# Patient Record
Sex: Male | Born: 1980 | Race: White | Hispanic: No | Marital: Married | Smoking: Never smoker
Health system: Southern US, Community
[De-identification: ages and names within clinical notes are randomized; demographics above are authoritative.]

## PROBLEM LIST (undated history)

## (undated) DIAGNOSIS — N200 Calculus of kidney: Secondary | ICD-10-CM

## (undated) DIAGNOSIS — R011 Cardiac murmur, unspecified: Secondary | ICD-10-CM

## (undated) HISTORY — DX: Cardiac murmur, unspecified: R01.1

---
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
## 2005-02-22 DIAGNOSIS — N200 Calculus of kidney: Secondary | ICD-10-CM

## 2005-02-22 HISTORY — DX: Calculus of kidney: N20.0

## 2006-10-25 ENCOUNTER — Ambulatory Visit: Payer: Self-pay | Admitting: Specialist

## 2006-11-03 ENCOUNTER — Ambulatory Visit: Payer: Self-pay | Admitting: Specialist

## 2009-02-12 IMAGING — CT CT ABD-PELV W/O CM
1 of 2 series · 15 of 32 positions shown, 19 images · non-contrast
Comparison: none

REASON FOR EXAM: rt flank pain nausea  kidney stone
COMMENTS:

PROCEDURE:     CT  - CT ABDOMEN AND PELVIS W[DATE]  [DATE]
RESULT:     Comparison: No available comparison exam.
TECHNIQUE: CT examination of the abdomen and pelvis was performed without
contrast. Collimation is 3 mm.

[Series 2: soft tissue · axial · 0.73mm/px · z∈[-1242,-812]mm · 15 of 161 slices shown, 19 images]
[im 12/161  soft-tissue]
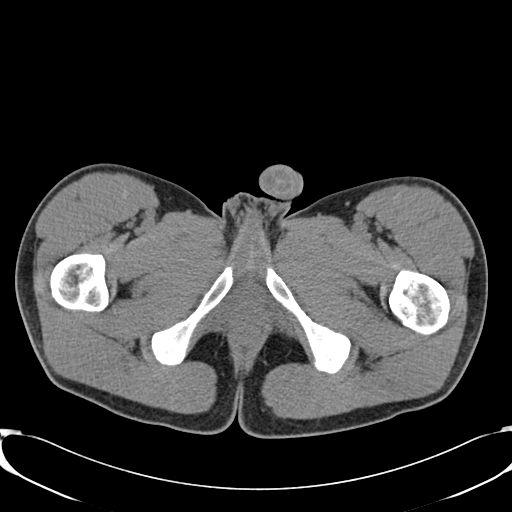
[im 12/161  bone]
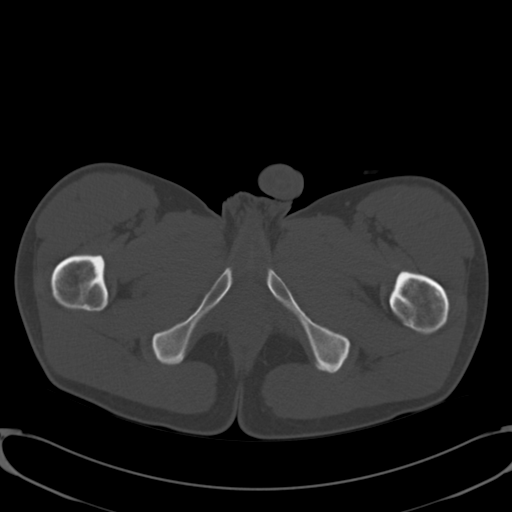
[im 24/161  soft-tissue]
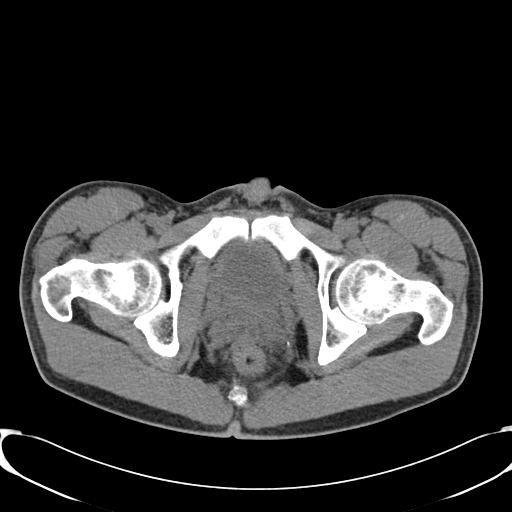
[im 36/161  soft-tissue]
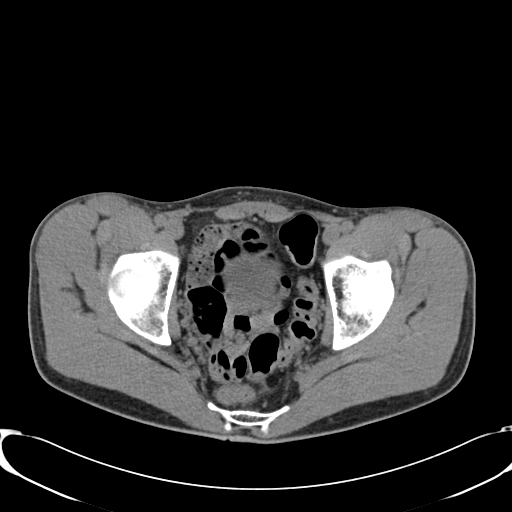
[im 48/161  soft-tissue]
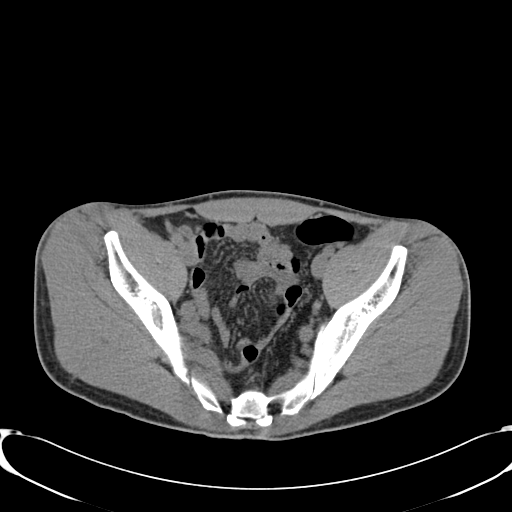
[im 60/161  soft-tissue]
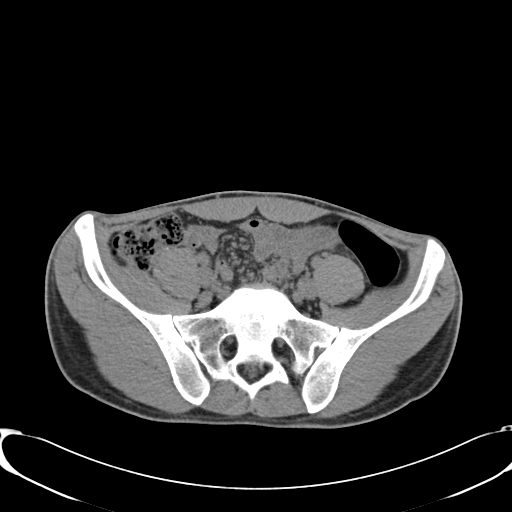
[im 72/161  soft-tissue]
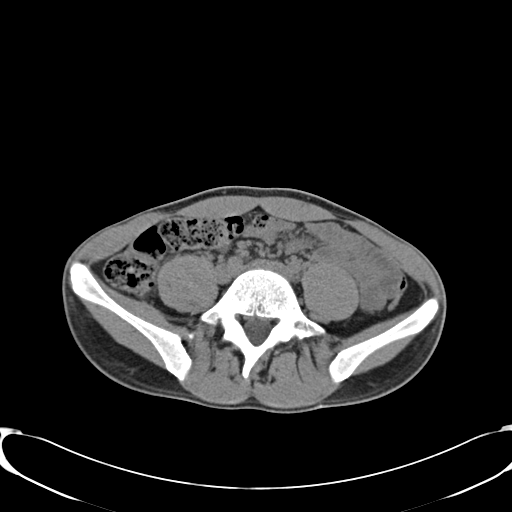
[im 83/161  soft-tissue]
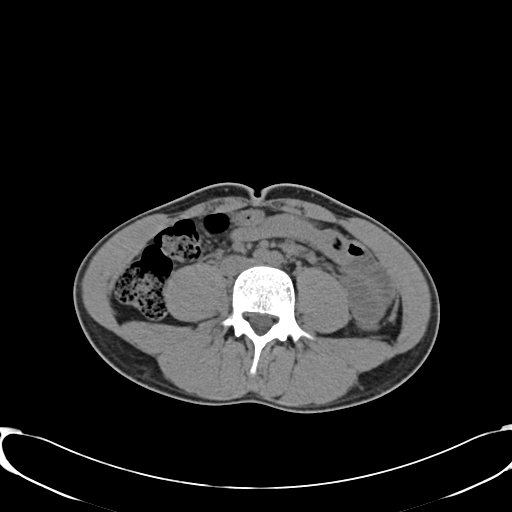
[im 95/161  soft-tissue]
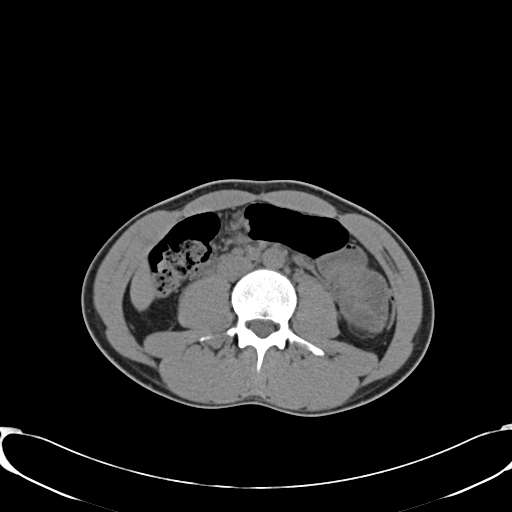
[im 107/161  soft-tissue]
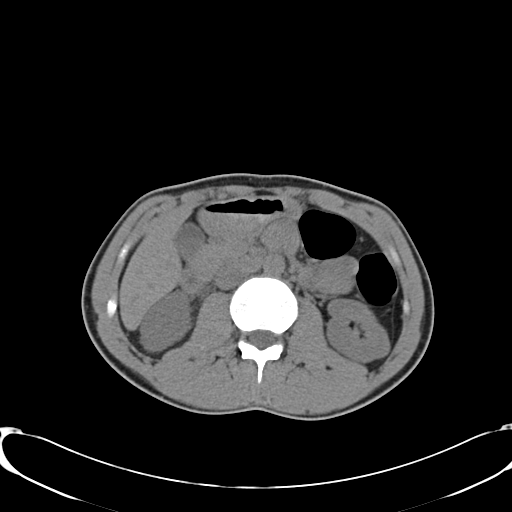
[im 107/161  bone]
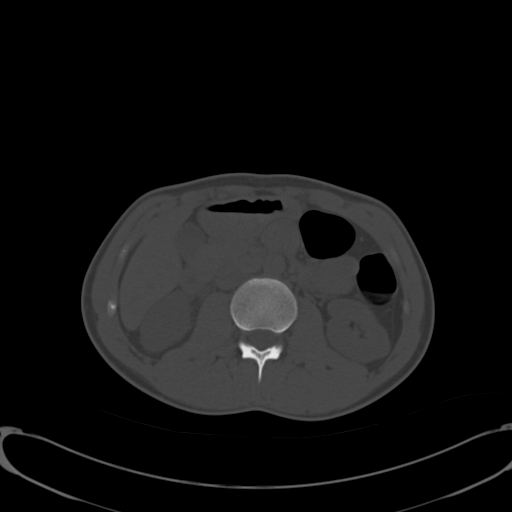
[im 119/161  soft-tissue]
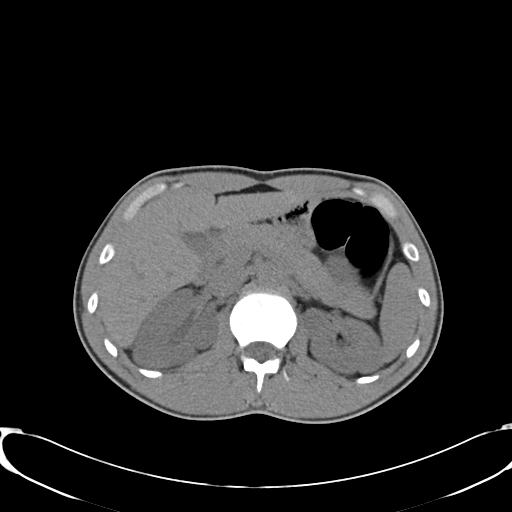
[im 131/161  soft-tissue]
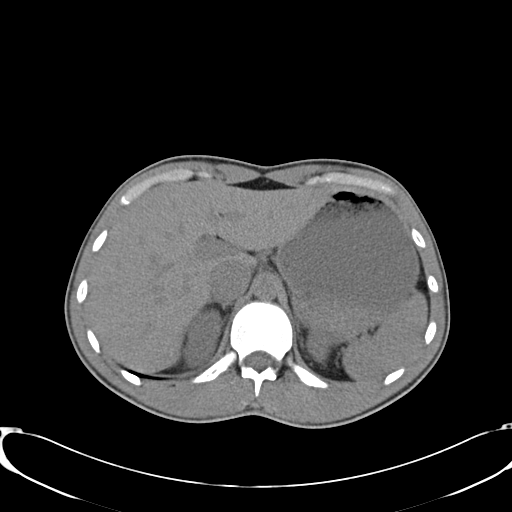
[im 137/161  lung]
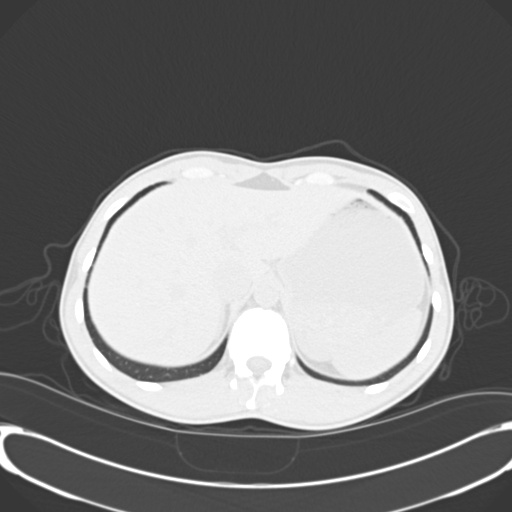
[im 143/161  soft-tissue]
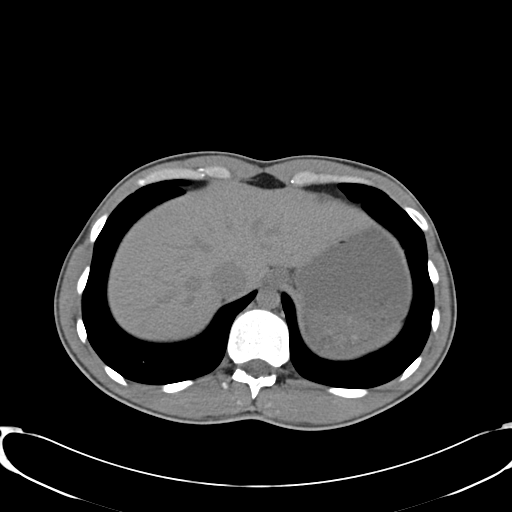
[im 143/161  lung]
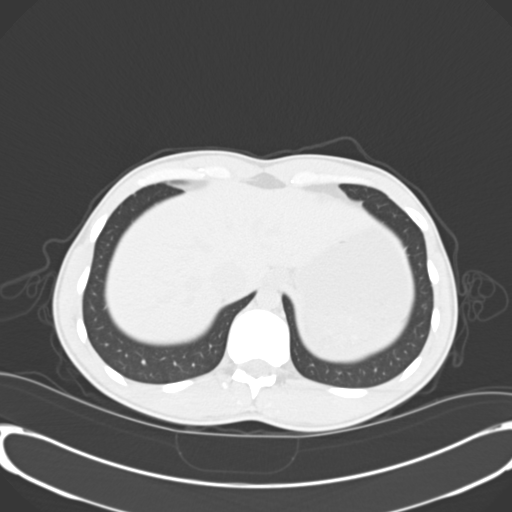
[im 149/161  lung]
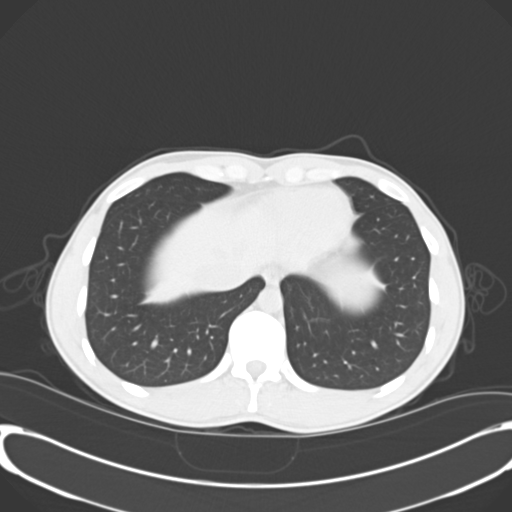
[im 155/161  soft-tissue]
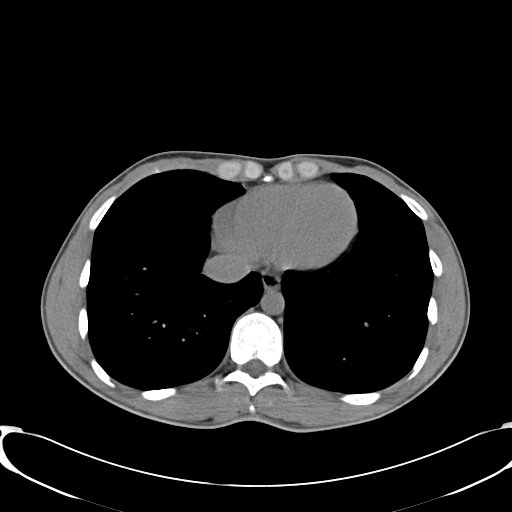
[im 155/161  lung]
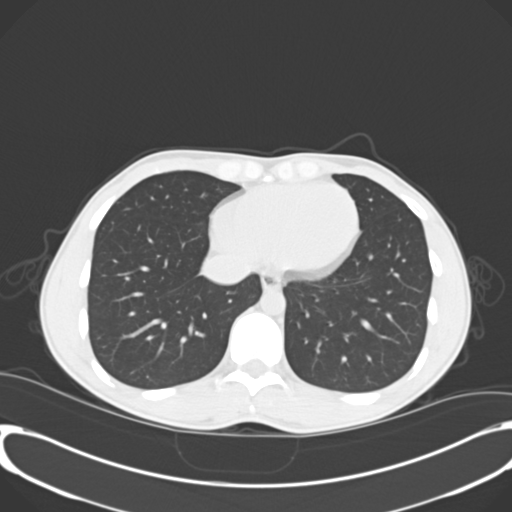

[15 of 32 positions shown; findings below may reference images not displayed]

FINDINGS: Limited evaluation of the lung bases is unremarkable

Evaluation of the abdominal organs, bowels, and vessels is limited without
contrast. The liver, spleen, pancreas, and adrenal glands are grossly
unremarkable. Sludge is seen within the gallbladder. There is a 3 mm
proximal right ureteral stone with associated mild more proximal ureteral
and renal pelvic dilatation. Additional 2 mm nonobstructing right interpolar
renal stone is seen.

There is no dilatation of the bowels. The appendix is not definitely seen.
Surgical material is seen in the right lower quadrant. Question prior
appendectomy. There is no intraperitoneal free air. There is no significant
and intraperitoneal fat straining. There are no enlarged abdominal pelvic
lymph nodes.
IMPRESSION: 1. There is a 3 mm proximal right ureteral stone with associated mild more
proximal ureteral and renal pelvic dilatation. Additional 2 mm
nonobstructing right interpolar renal stone is seen.

Findings were called to Dr. [REDACTED] at [DATE] on 10/25/06.

## 2018-12-18 ENCOUNTER — Ambulatory Visit (INDEPENDENT_AMBULATORY_CARE_PROVIDER_SITE_OTHER): Payer: Self-pay | Admitting: Family Medicine

## 2018-12-18 ENCOUNTER — Other Ambulatory Visit: Payer: Self-pay

## 2018-12-18 ENCOUNTER — Encounter: Payer: Self-pay | Admitting: Family Medicine

## 2018-12-18 VITALS — BP 147/88 | HR 81 | Temp 98.3°F | Ht 69.0 in | Wt 159.8 lb

## 2018-12-18 DIAGNOSIS — L720 Epidermal cyst: Secondary | ICD-10-CM | POA: Insufficient documentation

## 2018-12-18 DIAGNOSIS — R002 Palpitations: Secondary | ICD-10-CM

## 2018-12-18 DIAGNOSIS — Z0000 Encounter for general adult medical examination without abnormal findings: Secondary | ICD-10-CM | POA: Insufficient documentation

## 2018-12-18 NOTE — Progress Notes (Signed)
New Patient Office Visit  Subjective:  Patient ID: Todd Proctor, male    DOB: 1980-05-28  Age: 38 y.o. MRN: [id number]  CC:  Chief Complaint  Patient presents with  . Establish Care  . Cyst    chest area  . Irregular Heart Beat    heart murmur    HPI DAMONE FANCHER presents for palpitations with associated SOB. After a deep breath, HR slows back down. No syncope. Lightheaded.  Dizziness associated.  No trigger.  Varies in time-secs to minutes. No ECG changes noted at prior physician.  No cardiac work up.  +FH-MI in father, rare valve issue.  Pt states he is not aware of his cholesterol readings.  No DM. No FH DM.   Blood pressure elevated , HR elevated-greater than 100  Past Medical History:  Diagnosis Date  . Chronic kidney disease   . Heart murmur     PSH tonsils, appendix  Family History  Problem Relation Age of Onset  . Healthy Mother   . Heart disease Father   . Hyperlipidemia Father   . 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 Marital status: Married    Spouse name: Not on file  . Number of children: Not on file  . Years of education: Not on file  . Highest education level: Not on file  Occupational History  . Occupation: truck Animator Needs  . Financial resource strain: Not on file  . Food insecurity    Worry: Not on file    Inability: Not on file  . Transportation needs    Medical: Not on file    Non-medical: Not on file  Tobacco Use  . Smoking status: Never Smoker  . Smokeless tobacco: Never Used  Substance and Sexual Activity  . Alcohol use: Not Currently  . Drug use: Never  . Sexual activity: Yes  Lifestyle  . Physical activity    Days per week: Not on file    Minutes per session: Not on file  . Stress: Not on file  Relationships  . Social Herbalist on phone: Not on file    Gets together: Not on file     Attends religious service: Not on file    Active member of club or organization: Not on file    Attends meetings of clubs or organizations: Not on file    Relationship status: Not on file  . Intimate partner violence    Fear of current or ex partner: Not on file    Emotionally abused: Not on file    Physically abused: Not on file    Forced sexual activity: Not on file  Other Topics Concern  . Not on file  Social History Narrative  . Not on file    ROS Review of Systems  Constitutional: Negative.   HENT: Negative.   Eyes: Negative.   Respiratory: Negative.   Cardiovascular: Positive for palpitations.  Gastrointestinal: Negative.   Endocrine: Negative.   Genitourinary: Negative.        Kidney stones  Musculoskeletal:       Right shoulder pain-LROM  Allergic/Immunologic: Negative.   Neurological: Negative.   Hematological: Negative.   Psychiatric/Behavioral: Negative.     Objective:   Today's Vitals: BP (!) 147/88 (BP Location: Left Arm, Patient Position: Sitting, Cuff Size: Normal)   Pulse 81   Temp 98.3 F (36.8  C) (Oral)   Ht 5\' 9"  (1.753 m)   Wt 159 lb 12.8 oz (72.5 kg)   SpO2 98%   BMI 23.60 kg/m   Physical Exam Constitutional:      Appearance: Normal appearance.  HENT:     Head: Normocephalic and atraumatic.     Right Ear: Tympanic membrane and ear canal normal.     Left Ear: Tympanic membrane, ear canal and external ear normal.     Nose: Nose normal.     Mouth/Throat:     Mouth: Mucous membranes are moist.  Eyes:     Conjunctiva/sclera: Conjunctivae normal.  Neck:     Musculoskeletal: Normal range of motion and neck supple.  Cardiovascular:     Rate and Rhythm: Normal rate and regular rhythm.     Pulses: Normal pulses.     Heart sounds: Normal heart sounds.  Pulmonary:     Effort: Pulmonary effort is normal.     Breath sounds: Normal breath sounds.  Musculoskeletal: Normal range of motion.  Skin:    Comments: Inclusion cyst-chest wall   Neurological:     Mental Status: He is alert and oriented to person, place, and time.  Psychiatric:        Mood and Affect: Mood normal.        Behavior: Behavior normal.     Assessment & Plan:  1. Palpitations - CBC - COMPLETE METABOLIC PANEL WITH GFR - TSH - Lipid panel - EKG 12-Lead-reviewed with pt - Ambulatory referral to Cardiology  2. Wellness examination - Lipid panel-+FH CAD with MI-father 3. Inclusion cyst-prep chest with iodine and alcohol-using sterile tweezers cyst removed. Alcohol swab to area. Antibiotic ointment placed on site Skin instructions given for care-will call for concerns Follow-up:   , MD

## 2018-12-18 NOTE — Patient Instructions (Signed)
Cardiology referral  Fasting blood work

## 2018-12-20 ENCOUNTER — Encounter: Payer: Self-pay | Admitting: Cardiology

## 2018-12-20 ENCOUNTER — Other Ambulatory Visit: Payer: Self-pay

## 2018-12-20 ENCOUNTER — Ambulatory Visit (INDEPENDENT_AMBULATORY_CARE_PROVIDER_SITE_OTHER): Payer: Self-pay | Admitting: Cardiology

## 2018-12-20 VITALS — BP 136/78 | HR 90 | Ht 69.0 in | Wt 158.0 lb

## 2018-12-20 DIAGNOSIS — L720 Epidermal cyst: Secondary | ICD-10-CM

## 2018-12-20 DIAGNOSIS — R002 Palpitations: Secondary | ICD-10-CM

## 2018-12-20 NOTE — Progress Notes (Signed)
Cardiology Office Note:    Date:  12/20/2018   ID:  Todd Proctor, DOB February 25, 1980, MRN [id number]  PCP:  Maryruth Hancock, MD  Cardiologist:  Jenean Lindau, MD   Referring MD: Maryruth Hancock, MD    ASSESSMENT:    1. Palpitations    PLAN:    In order of problems listed above:  1. Palpitations: I reassured the patient about my findings.  He is planning to get all blood work by his primary care physician including TSH.  I will await those records.  I discussed with him about echocardiogram and a 2-week ZIO monitor to understand his symptomatology.  He does not seem to be very keen on it because he mentions to me that he has no insurance and that he does not want to spend this money as he is in financial type situation at this time.  I respect his wishes.  Further recommendations will be made based on how he wants to proceed with the stress and I recommended him the stress to understand what his palpitations may mean.Patient will be seen in follow-up appointment in 6 months or earlier if the patient has any concerns 2. He knows to go to the nearest emergency room for any concerning symptoms.   Medication Adjustments/Labs and Tests Ordered: Current medicines are reviewed at length with the patient today.  Concerns regarding medicines are outlined above.  No orders of the defined types were placed in this encounter.  No orders of the defined types were placed in this encounter.    History of Present Illness:    Todd Proctor is a 38 y.o. male who is being seen today for the evaluation of palpitations at the request of Corum, Rex Kras, MD.  Patient is a pleasant 38 year old male.  He has no significant past medical history.  He mentions to me that occasionally he feels palpitations for which he is referred here.  No chest pain orthopnea PND.  No dizzy spells or syncopal spells.  He drives a truck.  He is overall a healthy gentleman.  At the time of my evaluation, the patient is alert  awake oriented and in no distress.  Past Medical History:  Diagnosis Date  . Heart murmur   . Kidney stones 2007   lithotripsy     No past surgical history on file.  Current Medications: No outpatient medications have been marked as taking for the 12/20/18 encounter (Office Visit) with Chino Sardo, Reita Cliche, MD.     Allergies:   Patient has no known allergies.   Social History   Socioeconomic History  . Marital status: Married    Spouse name: Not on file  . Number of children: Not on file  . Years of education: Not on file  . Highest education level: Not on file  Occupational History  . Occupation: truck Animator Needs  . Financial resource strain: Not on file  . Food insecurity    Worry: Not on file    Inability: Not on file  . Transportation needs    Medical: Not on file    Non-medical: Not on file  Tobacco Use  . Smoking status: Never Smoker  . Smokeless tobacco: Never Used  Substance and Sexual Activity  . Alcohol use: Not Currently  . Drug use: Never  . Sexual activity: Yes  Lifestyle  . Physical activity    Days per week: Not on file    Minutes per session: Not on file  .  Stress: Not on file  Relationships  . Social Musician on phone: Not on file    Gets together: Not on file    Attends religious service: Not on file    Active member of club or organization: Not on file    Attends meetings of clubs or organizations: Not on file    Relationship status: Not on file  Other Topics Concern  . Not on file  Social History Narrative  . Not on file     Family History: The patient's family history includes Healthy in his brother and mother; Heart disease in his father and sister; Hyperlipidemia in his father.  ROS:   Please see the history of present illness.    All other systems reviewed and are negative.  EKGs/Labs/Other Studies Reviewed:    The following studies were reviewed today: EKG reveals sinus rhythm and nonspecific ST-T  changes.   Recent Labs: No results found for requested labs within last 8760 hours.  Recent Lipid Panel No results found for: CHOL, TRIG, HDL, CHOLHDL, VLDL, LDLCALC, LDLDIRECT  Physical Exam:    VS:  BP 136/78 (BP Location: Right Arm, Patient Position: Sitting, Cuff Size: Normal)   Pulse 90   Ht 5\' 9"  (1.753 m)   Wt 158 lb (71.7 kg)   SpO2 99%   BMI 23.33 kg/m     Wt Readings from Last 3 Encounters:  12/20/18 158 lb (71.7 kg)  12/18/18 159 lb 12.8 oz (72.5 kg)     GEN: Patient is in no acute distress HEENT: Normal NECK: No JVD; No carotid bruits LYMPHATICS: No lymphadenopathy CARDIAC: S1 S2 regular, 2/6 systolic murmur at the apex. RESPIRATORY:  Clear to auscultation without rales, wheezing or rhonchi  ABDOMEN: Soft, non-tender, non-distended MUSCULOSKELETAL:  No edema; No deformity  SKIN: Warm and dry NEUROLOGIC:  Alert and oriented x 3 PSYCHIATRIC:  Normal affect    Signed, 12/20/18, MD  12/20/2018 3:28 PM    [facility]

## 2018-12-20 NOTE — Patient Instructions (Signed)
Medication Instructions:  Your physician recommends that you continue on your current medications as directed. Please refer to the Current Medication list given to you today.  *If you need a refill on your cardiac medications before your next appointment, please call your pharmacy*  Lab Work: NONE If you have labs (blood work) drawn today and your tests are completely normal, you will receive your results only by: Marland Kitchen MyChart Message (if you have MyChart) OR . A paper copy in the mail If you have any lab test that is abnormal or we need to change your treatment, we will call you to review the results.  Testing/Procedures: NONE  Follow-Up: At [facility], you and your health needs are our priority.  As part of our continuing mission to provide you with exceptional heart care, we have created designated Provider Care Teams.  These Care Teams include your primary Cardiologist (physician) and Advanced Practice Providers (APPs -  Physician Assistants and Nurse Practitioners) who all work together to provide you with the care you need, when you need it.  Your next appointment:   6 months  The format for your next appointment:   In Person  Provider:

## 2018-12-22 ENCOUNTER — Telehealth: Payer: Self-pay

## 2018-12-22 NOTE — Telephone Encounter (Signed)
patient states that he is not able to do labs now is does not have the funds to pay out of pocket right now he will call back and r/s when he is able

## 2020-07-17 ENCOUNTER — Other Ambulatory Visit: Payer: Self-pay

## 2020-07-17 ENCOUNTER — Ambulatory Visit (INDEPENDENT_AMBULATORY_CARE_PROVIDER_SITE_OTHER): Payer: Self-pay | Admitting: Urology

## 2020-07-17 ENCOUNTER — Encounter: Payer: Self-pay | Admitting: Urology

## 2020-07-17 VITALS — BP 141/75 | HR 86 | Ht 71.0 in | Wt 162.0 lb

## 2020-07-17 DIAGNOSIS — Z3009 Encounter for other general counseling and advice on contraception: Secondary | ICD-10-CM

## 2020-07-17 MED ORDER — DIAZEPAM 5 MG PO TABS: 5.0000 mg | ORAL_TABLET | Freq: Once | ORAL | 0 refills | Status: DC | PRN

## 2020-07-17 NOTE — Patient Instructions (Signed)
Pre-Vasectomy Instructions  STOP all aspirin or blood thinners (Aspirin, Plavix, Coumadin, Warfarin, Motrin, Ibuprofen, Advil, Aleve, Naproxen, Naprosyn) for 7 days prior to the procedure.  If you have any questions about stopping these medications please contact your primary care physician or cardiologist.  Shave all hair from the upper scrotum on the day of the procedure.  This means just under the penis onto the scrotal sac.  The area shaved should measure about 2-3 inches around.  You may lather the scrotum with soap and water, and shave with a safety razor.  After shaving the area, thoroughly wash the penis and the scrotum, then shower or bathe to remove all the loose hairs.  If needed, wash the area again just before coming in for your circumcision.  It is recommended to have a light meal an hour or so prior to the procedure.  Bring a scrotal support (jock strap or suspensory, or tight jockey shorts or underwear).  Wear comfortable pants or shorts.  While the actual procedure usually takes about 45 minutes, you should be prepared to stay in the office for approximately one hour.  Bring someone with you to drive you home.  If you have any questions or concerns, please feel free to call the office at [phone].  Post Vasectomy After Care This sheet gives you information about how to care for yourself after your procedure.  What can I expect after the procedure? After the procedure, it is common to have: Mild/moderate pain and discomfort. If you have pain or discomfort immediately after the vasectomy, you may use OTC pain medication for relief, ex: Tylenol, Ibuprofen. After the local anesthetic wears off an ice pack may help to provide additional comfort and can also prevent swelling. Do not place ice pack directly on your skin.  Swelling of your scrotum/testicles or or slight redness on your scrotum/testicles around the incision site. Black and Blue bruising as the tissue heals Some  slight blood/drainage coming from your incisions or puncture sites for 1 or 2 days. If you have stitches placed they do not need to be removed, they will dissolve on their own after time.  Edges of the incision may come apart and heal slowly, sometimes a knot may be present which can remain for several months. This is normal and part of the healing process Blood in your semen. Follow these instructions at home: Activity For the first 48-72 hours after the procedure, avoid physical activity and exercise that requires heavy lifting. (5-10lbs) Do not take part in sports or perform heavy physical labor until your pain has improved, or until your health care provider says it is okay. You may have limits on the amount of weight you can lift as told by your health care provider. Do not ejaculate for at least 1 week after the procedure, or for as long as you are told. You may resume sexual activity 7-10 days after your procedure, or when your health care provider approves. Use a different method of birth control (contraception) until you have had test results that confirm that there is no sperm in your semen.  Wound Care: Shower only after 24 hours No tub baths, hot tub, or pools for at least 7 days Scrotal support Use scrotal support, such as a jockstrap or underwear with a supportive pouch, as needed for 1 week after your procedure. If you feel discomfort in your scrotum, you may remove the scrotal support to see if the discomfort is relieved. Sometimes scrotal support can   press on the scrotum and cause or worsen discomfort. If your skin gets irritated, you may add some germ-free (sterile), fluffed bandages or a clean washcloth to the scrotal support. Managing pain and swelling If directed, put ice on the affected area. To do this: Put ice in a plastic bag. Place a towel between your skin and the bag. Leave the ice on for 20 minutes, 2-3 times a day. Remove the ice if your skin turns bright red.  This is very important. If you cannot feel pain, heat, or cold, you have a greater risk of damage to the area.      TO DO:  10-15 Ejaculations to help clear the passage of sperm, you must be sure to use another form of birth control until you are told you may discontinue use!! You will be given a specimen cup to bring back a semen sample in 3 months for analysis, will call with results Contact a health care provider if: You have pus or a bad smell coming from an incision or puncture site. You have a fever of 101 or greater  Significant drainage or bleeding from the incision site Generalized redness of scrotum    

## 2020-07-17 NOTE — Progress Notes (Signed)
   07/17/20 3:44 PM   Todd Proctor September 23, 1980 [id number]  CC: Discuss vasectomy  HPI: Mr. Todd Proctor is a healthy 40 year old male with 6 children who desires vasectomy for permanent sterilization.  He denies any urinary symptoms or family history of prostate cancer.   PMH: Past Medical History:  Diagnosis Date  . Heart murmur   . Kidney stones 2007   lithotripsy     Family History: Family History  Problem Relation Age of Onset  . Healthy Mother   . Heart disease Father   . Hyperlipidemia Father   . Healthy Brother   . Heart disease Sister     Social History:  reports that he has never smoked. He has never used smokeless tobacco. He reports previous alcohol use. He reports that he does not use drugs.  Physical Exam: BP (!) 141/75   Pulse 86   Ht 5\' 11"  (1.803 m)   Wt 162 lb (73.5 kg)   BMI 22.59 kg/m    Constitutional:  Alert and oriented, No acute distress. Cardiovascular: No clubbing, cyanosis, or edema. Respiratory: Normal respiratory effort, no increased work of breathing. GI: Abdomen is soft, nontender, nondistended, no abdominal masses GU: Circumcised phallus with patent meatus, no lesions, testicles 20 cc and descended bilaterally without masses, vas deferens easily palpable bilaterally   Assessment & Plan:   Healthy 40 year old male who desires vasectomy for permanent sterilization.  We discussed the risks and benefits of vasectomy at length.  Vasectomy is intended to be a permanent form of contraception, and does not produce immediate sterility.  Following vasectomy another form of contraception is required until vas occlusion is confirmed by a post-vasectomy semen analysis obtained 2-3 months after the procedure.  Even after vas occlusion is confirmed, vasectomy is not 100% reliable in preventing pregnancy, and the failure rate is approximately 02/1998.  Repeat vasectomy is required in less than 1% of patients.  He should refrain from ejaculation for 1  week after vasectomy.  Options for fertility after vasectomy include vasectomy reversal, and sperm retrieval with in vitro fertilization or ICSI.  These options are not always successful and may be expensive.  Finally, there are other permanent and non-permanent alternatives to vasectomy available. There is no risk of erectile dysfunction, and the volume of semen will be similar to prior, as the majority of the ejaculate is from the prostate and seminal vesicles.   The procedure takes ~20 minutes.  We recommend patients take 5-10 mg of Valium 30 minutes prior, and he will need a driver post-procedure.  Local anesthetic is injected into the scrotal skin and a small segment of the vas deferens is removed, and the ends occluded. The complication rate is approximately 1-2%, and includes bleeding, infection, and development of chronic scrotal pain.  PLAN: Schedule vasectomy  03/1998, MD 07/17/2020  [facility] [address] [place], [place] [phone]

## 2020-07-30 ENCOUNTER — Other Ambulatory Visit: Payer: Self-pay

## 2020-07-30 ENCOUNTER — Ambulatory Visit (INDEPENDENT_AMBULATORY_CARE_PROVIDER_SITE_OTHER): Payer: Self-pay | Admitting: Urology

## 2020-07-30 ENCOUNTER — Encounter: Payer: Self-pay | Admitting: Urology

## 2020-07-30 VITALS — BP 151/93 | HR 62 | Ht 71.0 in | Wt 162.0 lb

## 2020-07-30 DIAGNOSIS — Z302 Encounter for sterilization: Secondary | ICD-10-CM

## 2020-07-30 HISTORY — PX: VASECTOMY: SHX75

## 2020-07-30 NOTE — Patient Instructions (Signed)
Vasectomy, Care After This sheet gives you information about how to care for yourself after your procedure. Your health care provider may also give you more specific instructions. If you have problems or questions, contact your health care provider. What can I expect after the procedure? After the procedure, it is common to have:  Mild pain, swelling, or discomfort in your scrotum or redness on your scrotum.  Some blood coming from your incisions or puncture sites for 1 or 2 days.  Blood in your semen. Follow these instructions at home: Medicines  Take over-the-counter and prescription medicines only as told by your health care provider.  Avoid taking any medicines that contain aspirin or NSAIDs, such as ibuprofen. These medicines can make bleeding worse. Activity  For the first 2 days after surgery, avoid physical activity and exercise that requires a lot of energy. Ask your health care provider what activities are safe for you.  Do not take part in sports or perform heavy physical labor until your pain has improved, or until your health care provider says it is okay.  You may have limits on the amount of weight you can lift as told by your health care provider.  Do not ejaculate for at least 1 week after the procedure, or for as long as you are told.  You may resume sexual activity 7-10 days after your procedure, or when your health care provider approves. Use a different method of birth control (contraception) until you have had test results that confirm that there is no sperm in your semen. Scrotal support  Use scrotal support, such as a jockstrap or underwear with a supportive pouch, as needed for 1 week after your procedure.  If you feel discomfort in your scrotum, you may remove the scrotal support to see if the discomfort is relieved. Sometimes scrotal support can press on the scrotum and cause or worsen discomfort.  If your skin gets irritated, you may add some germ-free  (sterile), fluffed bandages or a clean washcloth to the scrotal support. Managing pain and swelling If directed, put ice on the affected area. To do this:  Put ice in a plastic bag.  Place a towel between your skin and the bag.  Leave the ice on for 20 minutes, 2-3 times a day.  Remove the ice if your skin turns bright red. This is very important. If you cannot feel pain, heat, or cold, you have a greater risk of damage to the area.   General instructions  Check your incisions or puncture sites every day for signs of infection. Check for: ? Redness, swelling, or more pain. ? Fluid or blood. ? Warmth. ? Pus or a bad smell.  Leave stitches (sutures) in place. The sutures will dissolve on their own and do not need to be removed.  Keep all follow-up visits. This is important because you will need a test to confirm that there is no sperm in your semen. Multiple ejaculations are needed to clear out sperm that were beyond the vasectomy site. You will need one test result showing that there is no sperm in your semen before you can resume unprotected sex. This may take 2-4 months after your procedure.  If you were given a sedative during the procedure, it can affect you for several hours. Do not drive or operate machinery until your health care provider says that it is safe.   Contact a health care provider if:  You have redness, swelling, or more pain around an incision   or puncture site, or in your scrotum area.  You have bleeding from an incision or puncture site.  You have pus or a bad smell coming from an incision or puncture site.  You have a fever.  An incision or puncture site opens up. Get help right away if:  You develop a rash.  You have trouble breathing. Summary  After your procedure, it is common to have mild pain, swelling, redness, or discomfort in your scrotum.  For the first 2 days after surgery, avoid physical activity and exercise that requires a lot of  energy.  Put ice on the affected area. Leave the ice on for 20 minutes, 2-3 times a day.  If you were given a sedative during the procedure, it can affect you for several hours. Do not drive or operate machinery until your health care provider says that it is safe. This information is not intended to replace advice given to you by your health care provider. Make sure you discuss any questions you have with your health care provider. Document Revised: 06/28/2019 Document Reviewed: 06/28/2019 Elsevier Patient Education  2021 [employer]    

## 2020-07-30 NOTE — Progress Notes (Signed)
VASECTOMY PROCEDURE NOTE:  The patient was taken to the minor procedure room and placed in the supine position. His genitals were prepped and draped in the usual sterile fashion. The right vas deferens was brought up to the skin of the right upper scrotum. The skin overlying it was anesthetized with 1% lidocaine without epinephrine, anesthetic was also injected alongside the vas deferens in the direction of the inguinal canal. The no scalpel vasectomy instrument was used to make a small perforation in the scrotal skin. The vasectomy clamp was used to grasp the vas deferens. It was carefully dissected free from surrounding structures. A 1cm segment of the vas was removed, and the cut ends of the mucosa were cauterized. A figure of eight suture was used to perform fascial interposition. No significant bleeding was noted. The vas deferens was returned to the scrotum. The skin incision was closed with a simple interrupted stitch of 4-0 chromic.  Attention was then turned to the left side. The left vasectomy was performed in the same exact fashion. Sterile dressings were placed over each incision. The patient tolerated the procedure well.  IMPRESSION/DIAGNOSIS: The patient is a 40 year old gentleman who underwent a vasectomy today. Post-procedure instructions were reviewed. I stressed the importance of continuing to use birth control until he provides a semen specimen more than 2 months from now that demonstrates azoospermia.  We discussed return precautions including fever over 101, significant bleeding or hematoma, or uncontrolled pain. I also stressed the importance of avoiding strenuous activity for one week, no sexual activity or ejaculations for 5 days, intermittent icing over the next 48 hours, and scrotal support.   PLAN: The patient will be advised of his semen analysis results when available.  Legrand Rams, MD 07/30/2020

## 2020-08-04 ENCOUNTER — Telehealth: Payer: Self-pay

## 2020-08-04 NOTE — Telephone Encounter (Signed)
Patient called post VAS 07/30/20 stating that he stood up from the couch and his stitch on the left side caught on his underwear and he has had some minor bleeding noted. Bleeding has stopped now and he would like to make sure he does not need to come in and be seen. It was explained that he should monitor for now since bleeding has stopped. Signs to look out for would be possible infection worsening swelling, red/hot to the touch, puss and/or fever 101 or greater. He will call back if any of these occur for further evaluation.

## 2020-10-31 ENCOUNTER — Other Ambulatory Visit: Payer: Self-pay

## 2020-11-04 ENCOUNTER — Other Ambulatory Visit: Payer: Self-pay

## 2020-11-04 DIAGNOSIS — Z302 Encounter for sterilization: Secondary | ICD-10-CM

## 2020-11-05 LAB — POST-VAS SPERM EVALUATION,QUAL: Volume: 3 mL

## 2020-11-19 ENCOUNTER — Telehealth: Payer: Self-pay

## 2020-11-19 NOTE — Telephone Encounter (Signed)
Left pt a message to return call, my chart notification also sent

## 2020-11-19 NOTE — Telephone Encounter (Signed)
-----   Message from Sondra Come, MD sent at 11/06/2020  8:51 AM EDT ----- Sperm still present, encourage ejaculations to clear residual sperm in order more sensitive semen analysis in 6 weeks and will call with those results  Legrand Rams, MD 11/06/2020

## 2020-11-25 NOTE — Telephone Encounter (Signed)
Instructions and Lab requisition were previously mailed to patient

## 2020-11-25 NOTE — Telephone Encounter (Signed)
Mychart notification not yet read. Attempted to reach patient again by phone, no answer left detailed message notifying him (per DPR) of results and additional testing needed in 6 weeks.

## 2021-06-28 ENCOUNTER — Other Ambulatory Visit: Payer: Self-pay

## 2021-06-28 ENCOUNTER — Emergency Department
Admission: EM | Admit: 2021-06-28 | Discharge: 2021-06-28 | Disposition: A | Discharge status: Home / Self Care | Payer: Self-pay | Attending: Emergency Medicine | Admitting: Emergency Medicine

## 2021-06-28 ENCOUNTER — Emergency Department: Payer: Self-pay

## 2021-06-28 ENCOUNTER — Encounter: Payer: Self-pay | Admitting: Radiology

## 2021-06-28 DIAGNOSIS — R0789 Other chest pain: Secondary | ICD-10-CM | POA: Insufficient documentation

## 2021-06-28 LAB — CBC
HCT: 43.8 % (ref 39.0–52.0)
Hemoglobin: 14.3 g/dL (ref 13.0–17.0)
MCH: 29.5 pg (ref 26.0–34.0)
MCHC: 32.6 g/dL (ref 30.0–36.0)
MCV: 90.5 fL (ref 80.0–100.0)
Platelets: 263 10*3/uL (ref 150–400)
RBC: 4.84 MIL/uL (ref 4.22–5.81)
RDW: 12.8 % (ref 11.5–15.5)
WBC: 7.8 10*3/uL (ref 4.0–10.5)
nRBC: 0 % (ref 0.0–0.2)

## 2021-06-28 LAB — BASIC METABOLIC PANEL
Anion gap: 7 (ref 5–15)
BUN: 19 mg/dL (ref 6–20)
CO2: 28 mmol/L (ref 22–32)
Calcium: 8.5 mg/dL — ABNORMAL LOW (ref 8.9–10.3)
Chloride: 104 mmol/L (ref 98–111)
Creatinine, Ser: 1.02 mg/dL (ref 0.61–1.24)
GFR, Estimated: >60 mL/min (ref >60)
Glucose, Bld: 72 mg/dL (ref 70–99)
Potassium: 4.4 mmol/L (ref 3.5–5.1)
Sodium: 139 mmol/L (ref 135–145)

## 2021-06-28 LAB — TROPONIN I (HIGH SENSITIVITY): Troponin I (High Sensitivity): 2 ng/L (ref <18)

## 2021-06-28 MED ORDER — NAPROXEN 500 MG PO TABS: 500.0000 mg | ORAL_TABLET | Freq: Two times a day (BID) | ORAL | 2 refills | Status: AC

## 2021-06-28 NOTE — ED Notes (Signed)
Pt discharge signature placed in medical records bin ?

## 2021-06-28 NOTE — ED Triage Notes (Signed)
Pt states chest pain for a week. Pt denies shob, nausea, dizziness. Pt appears in no acute distress.  ?

## 2021-06-28 NOTE — ED Notes (Signed)
Pt to ED for L sided CP that started today, pain is described as  a pressure/ tightness, pain radiates down left arm and pt states he has felt his fingertips "tingling" . Pt denies SOB, and abdominal pain.  ?Pt states he did drive 007+ miles today. Denies leg pain.  ?Denies Cardiac Hx.  ? ?Pt is A&Ox4. ?

## 2021-06-28 NOTE — ED Provider Notes (Signed)
? ?[facility] ?Provider Note ? ? ? Event Date/Time  ? First MD Initiated Contact with Patient 06/28/21 2008   ?  (approximate) ? ? ?History  ? ?Chest Pain ? ? ?HPI ? ?Todd Proctor is a 41 y.o. male with a history of kidney stones who presents with complaints of left-sided chest pain.  Patient reports he has been going to the gym frequently and really working on his chest over the last several weeks.  He reports he had intermittent discomfort in the left side of his chest.  Today it seemed more significant with some radiation towards his shoulder.  He reports his resolved at this time.  No shortness of breath, no pleurisy.  Does not smoke.  No history of heart disease ?  ? ? ?Physical Exam  ? ?Triage Vital Signs: ?ED Triage Vitals  ?Enc Vitals Group  ?   BP 06/28/21 1903 (!) 143/88  ?   Pulse Rate 06/28/21 1903 73  ?   Resp 06/28/21 1903 16  ?   Temp 06/28/21 1903 98.5 ?F (36.9 ?C)  ?   Temp Source 06/28/21 1903 Oral  ?   SpO2 06/28/21 1903 100 %  ?   Weight 06/28/21 1904 74.8 kg (165 lb)  ?   Height 06/28/21 1904 1.778 m (5\' 10" )  ?   Head Circumference --   ?   Peak Flow --   ?   Pain Score 06/28/21 1904 3  ?   Pain Loc --   ?   Pain Edu? --   ?   Excl. in GC? --   ? ? ?Most recent vital signs: ?Vitals:  ? 06/28/21 2020 06/28/21 2100  ?BP: 127/85 122/83  ?Pulse: 73 74  ?Resp: 20 18  ?Temp:    ?SpO2: 99% 100%  ? ? ? ?General: Awake, no distress.  ?CV:  Good peripheral perfusion.  No chest wall tenderness palpation, regular rate and rhythm ?Resp:  Normal effort.  CTA bilaterally ?Abd:  No distention.  ?Other:   ? ? ?ED Results / Procedures / Treatments  ? ?Labs ?(all labs ordered are listed, but only abnormal results are displayed) ?Labs Reviewed  ?BASIC METABOLIC PANEL - Abnormal; Notable for the following components:  ?    Result Value  ? Calcium 8.5 (*)   ? All other components within normal limits  ?CBC  ?TROPONIN I (HIGH SENSITIVITY)  ?TROPONIN I (HIGH SENSITIVITY)  ? ? ? ?EKG ? ?ED  ECG REPORT ?I, 2101, the attending physician, personally viewed and interpreted this ECG. ? ?Date: 06/28/2021 ? ?Rhythm: normal sinus rhythm ?QRS Axis: normal ?Intervals: normal ?ST/T Wave abnormalities: normal ?Narrative Interpretation: no evidence of acute ischemia ? ? ? ?RADIOLOGY ?Chest x-ray viewed interpreted by me, no acute abnormality ? ? ? ?PROCEDURES: ? ?Critical Care performed:  ? ?Procedures ? ? ?MEDICATIONS ORDERED IN ED: ?Medications - No data to display ? ? ?IMPRESSION / MDM / ASSESSMENT AND PLAN / ED COURSE  ?I reviewed the triage vital signs and the nursing notes. ? ? ?Patient with no significant past medical history presents with complaints of chest pain.  This is acute presentation which could represent a threat to life or bodily function ? ?Differential includes chest wall pain, ACS, pneumonia, pneumothorax ? ?I suspect chest wall pain given reassuring EKG, normal high-sensitivity troponin ? ?Chest x-ray is clear, no evidence of pneumothorax. ? ?We will start the patient on NSAIDs, recommend outpatient follow-up as needed, return precautions discussed ? ? ? ? ?  ? ? ?  FINAL CLINICAL IMPRESSION(S) / ED DIAGNOSES  ? ?Final diagnoses:  ?Chest wall pain  ? ? ? ?Rx / DC Orders  ? ?ED Discharge Orders   ? ?      Ordered  ?  naproxen (NAPROSYN) 500 MG tablet  2 times daily with meals       ? 06/28/21 2049  ? ?  ?  ? ?  ? ? ? ?Note:  This document was prepared using Dragon voice recognition software and may include unintentional dictation errors. ?  ?Jene Every, MD ?06/28/21 2316 ? ?

## 2023-10-17 IMAGING — CR DG CHEST 2V
2 series · 2 of 2 positions shown · non-contrast
Comparison: None Available.

CLINICAL DATA: Chest pain for 1 week.

EXAM:
CHEST - 2 VIEW

[chest pa]
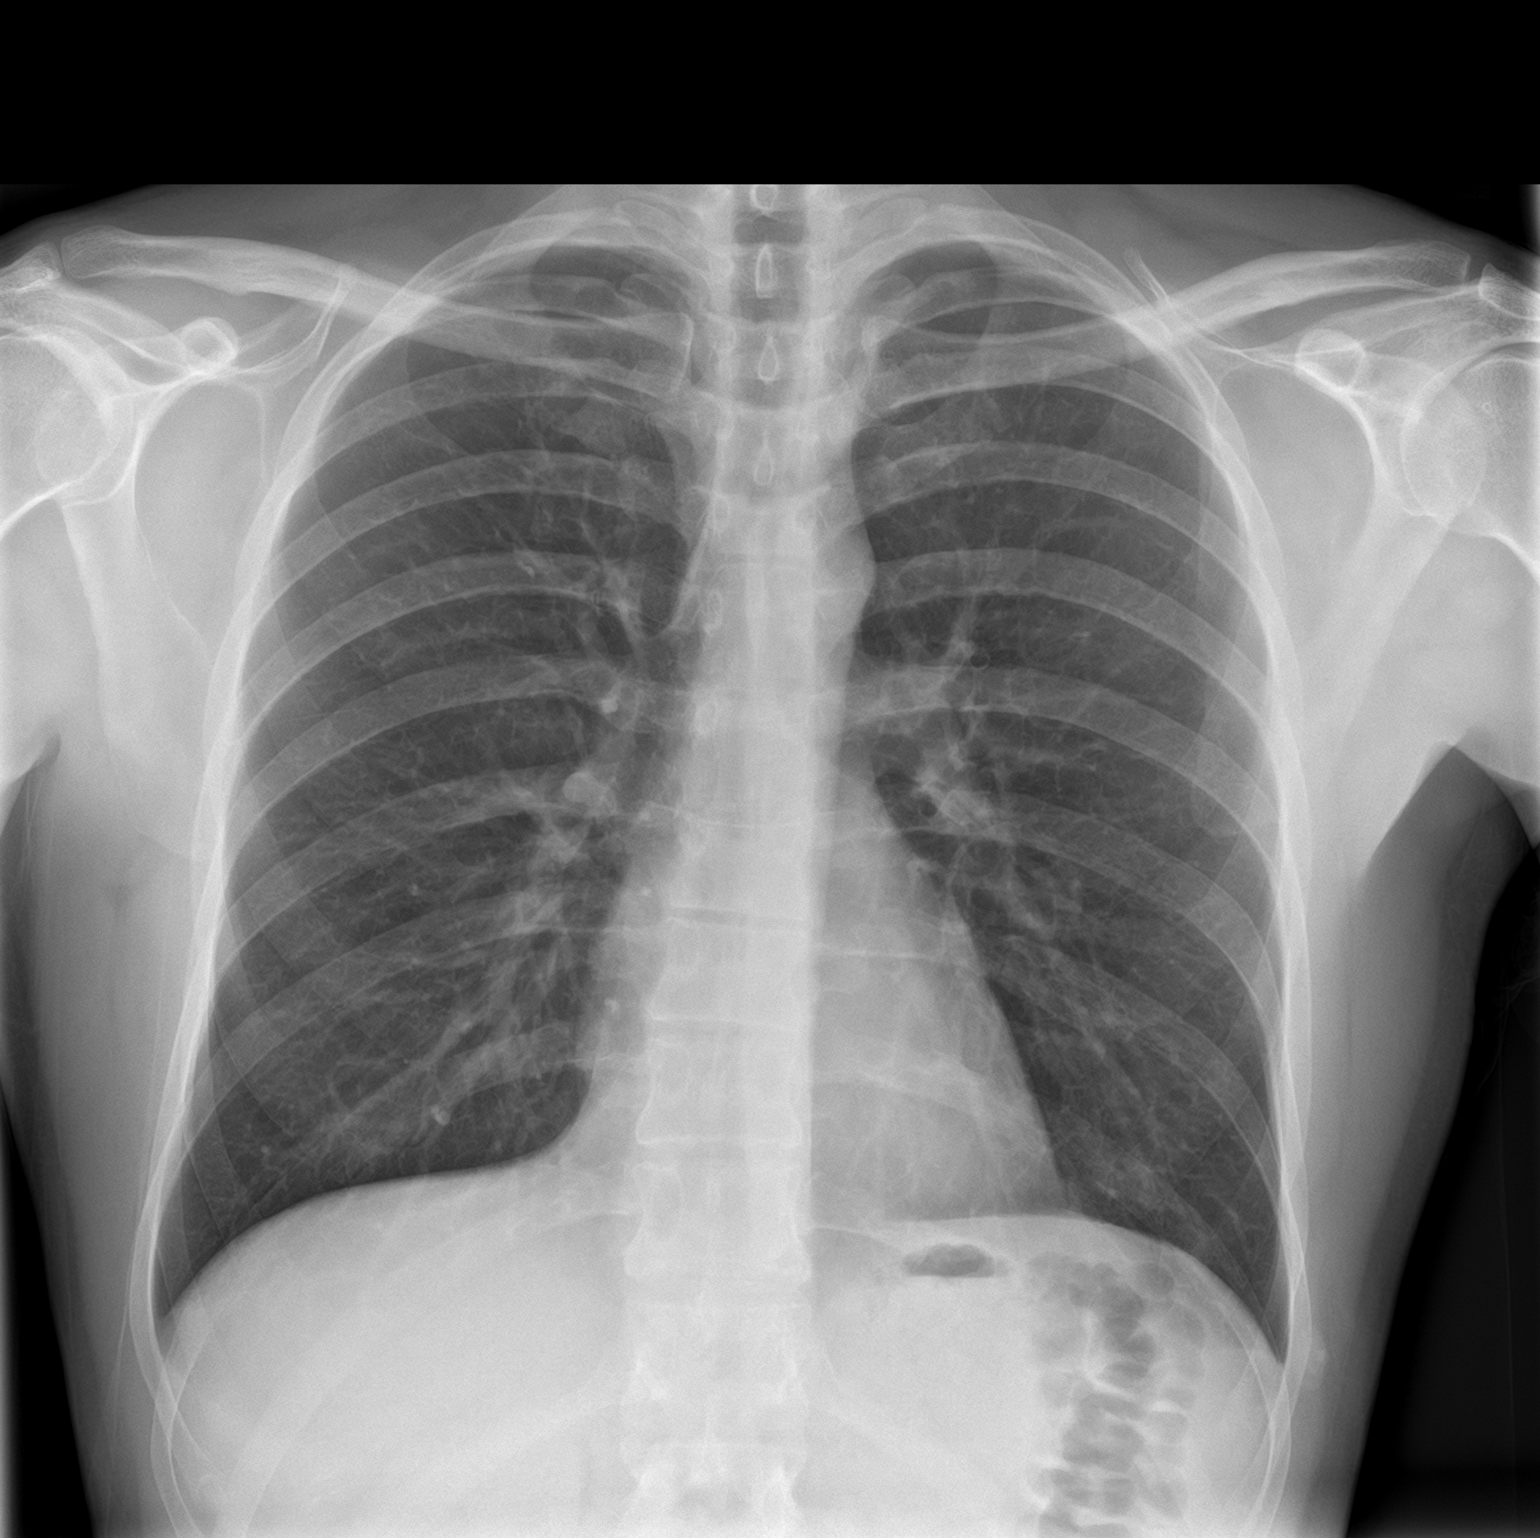

[chest lat]
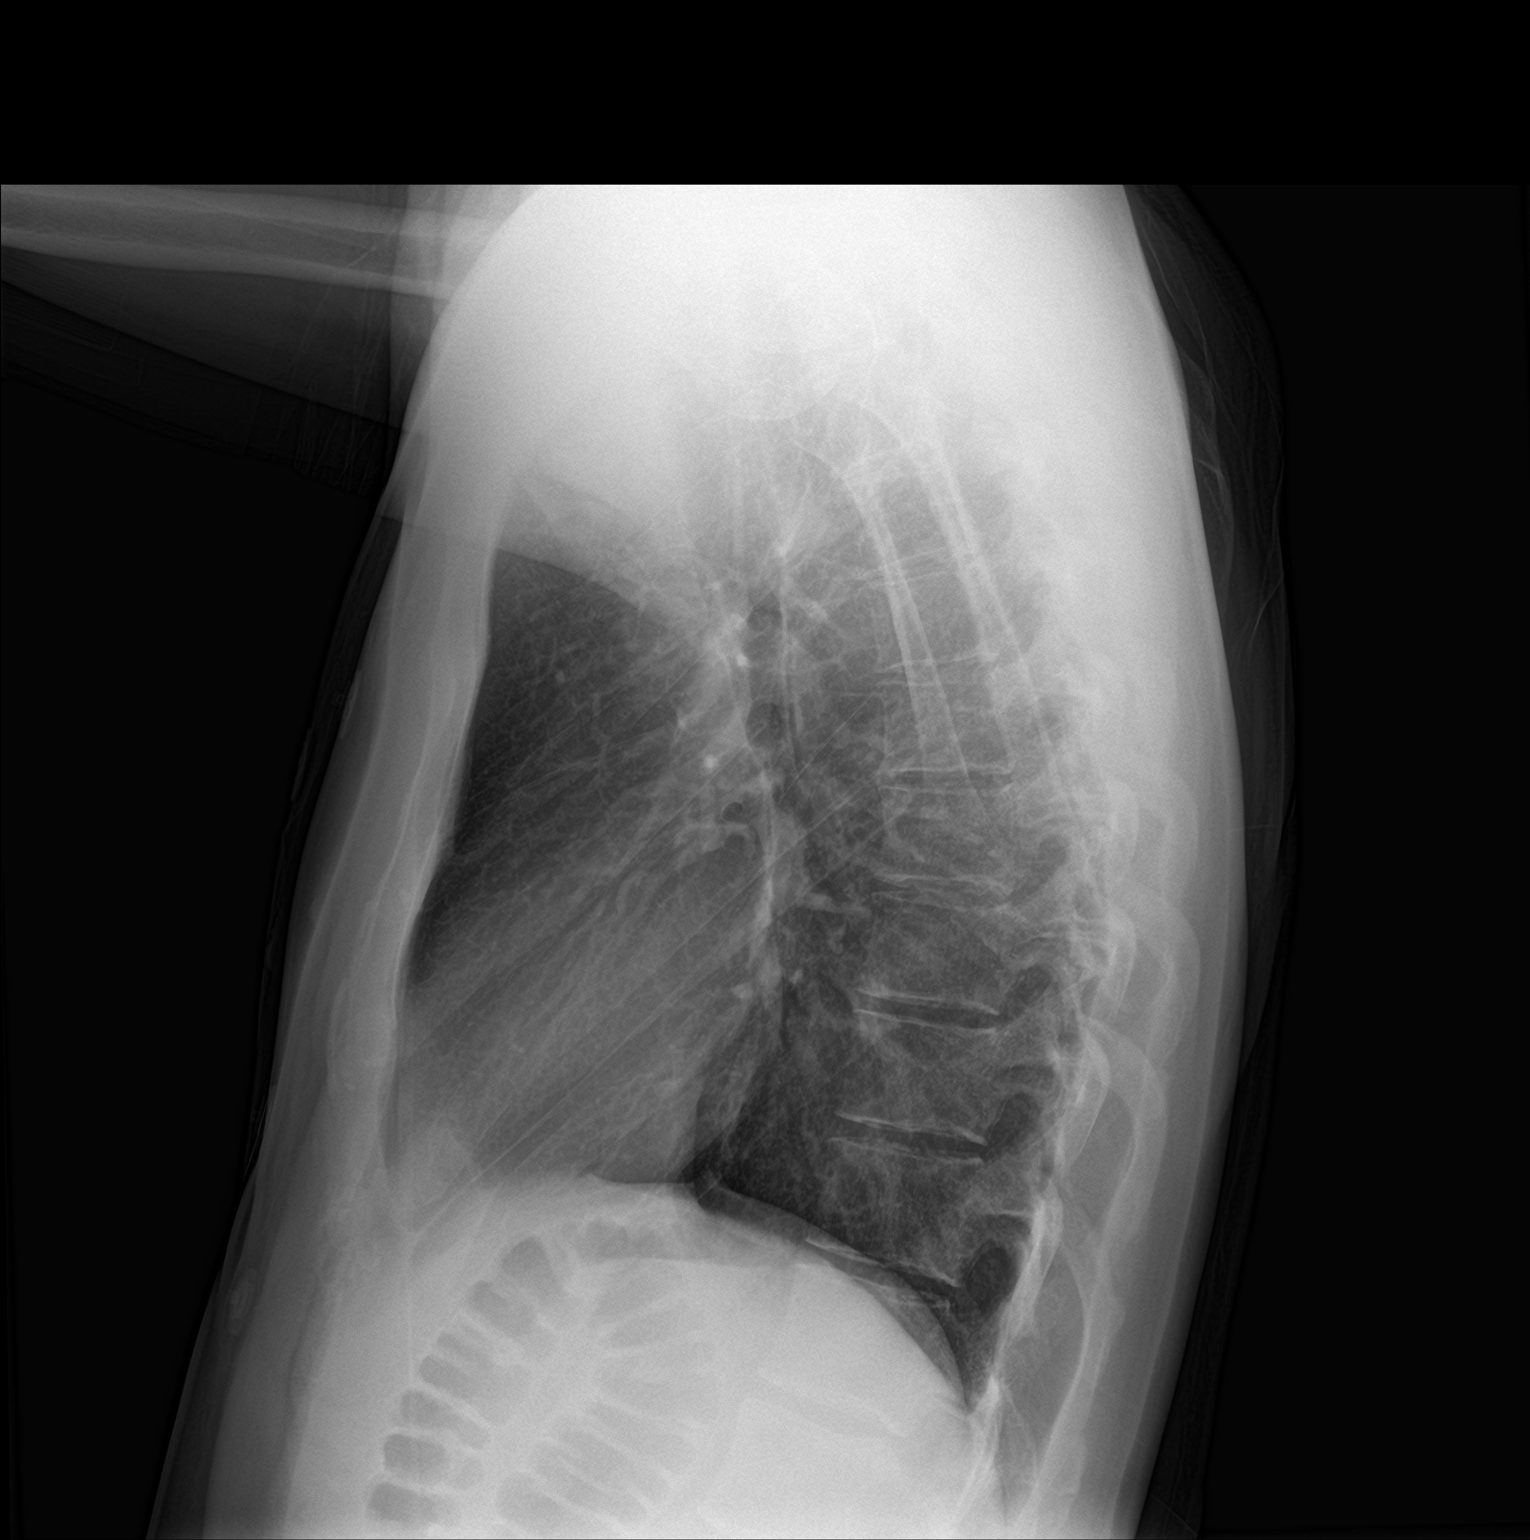

[2 of 2 positions shown; findings below may reference images not displayed]

FINDINGS: The cardiomediastinal silhouette is unremarkable.

There is no evidence of focal airspace disease, pulmonary edema,
suspicious pulmonary nodule/mass, pleural effusion, or pneumothorax.

No acute bony abnormalities are identified.
IMPRESSION: No active cardiopulmonary disease.
